# Patient Record
Sex: Female | Born: 2000 | Race: White | Hispanic: Yes | Marital: Single | Smoking: Current some day smoker
Health system: Southern US, Community
[De-identification: ages and names within clinical notes are randomized; demographics above are authoritative.]

---
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
## 2018-04-08 ENCOUNTER — Ambulatory Visit: Payer: Medicaid Other | Attending: Family Medicine | Admitting: Physical Therapy

## 2018-04-08 ENCOUNTER — Encounter: Payer: Self-pay | Admitting: Physical Therapy

## 2018-04-08 ENCOUNTER — Other Ambulatory Visit: Payer: Self-pay

## 2018-04-08 DIAGNOSIS — G44209 Tension-type headache, unspecified, not intractable: Secondary | ICD-10-CM

## 2018-04-08 DIAGNOSIS — M542 Cervicalgia: Secondary | ICD-10-CM

## 2018-04-08 NOTE — Patient Instructions (Signed)
Access Code: [email or web address]  URL: [email or web address]  Date: 04/08/2018  Prepared by: Zettie PhoMargaret Trotter   Exercises  Seated Thoracic Flexion - 5 reps - 1 sets - 30 hold - 2x daily - 7x weekly  Seated Cervical Retraction - 10 reps - 2 sets - 2x daily - 7x weekly
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
## 2018-04-16 ENCOUNTER — Ambulatory Visit: Payer: Medicaid Other | Admitting: Physical Therapy

## 2018-04-16 ENCOUNTER — Ambulatory Visit: Payer: Medicaid Other

## 2018-04-16 ENCOUNTER — Encounter: Payer: Self-pay | Admitting: Physical Therapy

## 2018-04-16 DIAGNOSIS — M542 Cervicalgia: Secondary | ICD-10-CM

## 2018-04-16 DIAGNOSIS — G44209 Tension-type headache, unspecified, not intractable: Secondary | ICD-10-CM
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
## 2018-04-18 ENCOUNTER — Ambulatory Visit: Payer: Medicaid Other | Admitting: Physical Therapy

## 2018-04-24 ENCOUNTER — Ambulatory Visit: Payer: Medicaid Other | Attending: Family Medicine | Admitting: Physical Therapy

## 2018-04-24 ENCOUNTER — Encounter: Payer: Self-pay | Admitting: Physical Therapy

## 2018-04-24 DIAGNOSIS — G44209 Tension-type headache, unspecified, not intractable: Secondary | ICD-10-CM | POA: Insufficient documentation

## 2018-04-24 DIAGNOSIS — M542 Cervicalgia: Secondary | ICD-10-CM | POA: Diagnosis present
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
## 2018-04-29 ENCOUNTER — Encounter: Payer: Medicaid Other | Admitting: Physical Therapy

## 2018-04-30 ENCOUNTER — Ambulatory Visit: Payer: Medicaid Other | Admitting: Physical Therapy

## 2018-05-02 ENCOUNTER — Encounter: Payer: Self-pay | Admitting: Physical Therapy

## 2018-05-02 ENCOUNTER — Ambulatory Visit: Payer: Medicaid Other | Admitting: Physical Therapy

## 2018-05-02 DIAGNOSIS — M542 Cervicalgia: Secondary | ICD-10-CM | POA: Diagnosis not present

## 2018-05-02 DIAGNOSIS — G44209 Tension-type headache, unspecified, not intractable: Secondary | ICD-10-CM

## 2018-05-02 NOTE — Patient Instructions (Signed)
  Copyright  VHI. All rights reserved.  Shoulder Retraction   Tie band around door knob (sitting or standing, holding band in both hands) Facing chest height anchor, grasp ends of band and pull hands to chest, squeezing shoulder blades together. Hold _3-5seconds. Repeat _10 times. Do _2_ sessions per day. Safety Note: Be sure anchor is secure.  Copyright  VHI. All rights reserved.  Strengthening: Resisted Extension   Hold band in both hands, Pull arm back, elbow straight, squeezing shoulder blades, Repeat _10___ times per set. Do _2___ sets per session. Do __1__ sessions per day.  http://orth.exer.us/833   Copyright  VHI. All rights reserved.   Copyright  VHI. All rights reserved.  Reverse Fly / Shoulder Retraction  Holding band in both hands, Extend both arms in front of body at shoulder height, palms down, holding band. Move arms out to sides, squeeze shoulder blades together. Repeat _10__ times. Do _2__ sessions per day.   Copyright  VHI. All rights reserved.   Roll   Inhale and bring shoulders up, back, then exhale and relax shoulders down. Repeat _10__ times. Do _2-3__ times per day.  Copyright  VHI. All rights reserved.     
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
## 2018-05-07 ENCOUNTER — Encounter: Payer: Self-pay | Admitting: Physical Therapy

## 2018-05-07 ENCOUNTER — Ambulatory Visit: Payer: Medicaid Other | Admitting: Physical Therapy

## 2018-05-07 DIAGNOSIS — G44209 Tension-type headache, unspecified, not intractable: Secondary | ICD-10-CM

## 2018-05-07 DIAGNOSIS — M542 Cervicalgia: Secondary | ICD-10-CM | POA: Diagnosis not present
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
## 2018-05-09 ENCOUNTER — Encounter: Payer: Medicaid Other | Admitting: Physical Therapy

## 2019-03-30 ENCOUNTER — Ambulatory Visit
Admission: RE | Admit: 2019-03-30 | Discharge: 2019-03-30 | Disposition: A | Discharge status: Home / Self Care | Payer: Medicaid Other | Attending: Family Medicine | Admitting: Family Medicine

## 2019-03-30 ENCOUNTER — Other Ambulatory Visit: Payer: Self-pay | Admitting: Family Medicine

## 2019-03-30 ENCOUNTER — Ambulatory Visit
Admission: RE | Admit: 2019-03-30 | Discharge: 2019-03-30 | Disposition: A | Discharge status: Home / Self Care | Payer: Medicaid Other | Source: Ambulatory Visit | Attending: Family Medicine | Admitting: Family Medicine

## 2019-03-30 DIAGNOSIS — T148XXA Other injury of unspecified body region, initial encounter: Secondary | ICD-10-CM | POA: Insufficient documentation

## 2019-05-23 ENCOUNTER — Other Ambulatory Visit: Payer: Self-pay

## 2019-05-23 DIAGNOSIS — Z20822 Contact with and (suspected) exposure to covid-19: Secondary | ICD-10-CM

## 2019-05-24 LAB — NOVEL CORONAVIRUS, NAA: SARS-CoV-2, NAA: NOT DETECTED

## 2019-05-30 ENCOUNTER — Other Ambulatory Visit: Payer: Self-pay | Admitting: *Deleted

## 2019-05-30 DIAGNOSIS — Z20822 Contact with and (suspected) exposure to covid-19: Secondary | ICD-10-CM

## 2019-05-31 LAB — NOVEL CORONAVIRUS, NAA: SARS-CoV-2, NAA: NOT DETECTED

## 2019-10-03 ENCOUNTER — Ambulatory Visit: Payer: Medicaid Other | Attending: Internal Medicine

## 2019-10-03 DIAGNOSIS — Z23 Encounter for immunization: Secondary | ICD-10-CM

## 2019-10-03 NOTE — Progress Notes (Signed)
   Covid-19 Vaccination Clinic  Name:  Gloria Crawford    MRN: [id number] DOB: 01/27/2001  10/03/2019  [name] was observed post Covid-19 immunization for 15 minutes without incident. She was provided with Vaccine Information Sheet and instruction to access the V-Safe system.   [name] was instructed to call 911 with any severe reactions post vaccine: Marland Kitchen Difficulty breathing  . Swelling of face and throat  . A fast heartbeat  . A bad rash all over body  . Dizziness and weakness   Immunizations Administered    Name Date Dose VIS Date Route   Pfizer COVID-19 Vaccine 10/03/2019 11:30 AM 0.3 mL 07/04/2019 Intramuscular   Manufacturer: [employer], [employer]   Lot: [id number]   NDC: [address]-0

## 2019-10-29 ENCOUNTER — Ambulatory Visit: Payer: Medicaid Other | Attending: Internal Medicine

## 2019-10-29 DIAGNOSIS — Z23 Encounter for immunization: Secondary | ICD-10-CM

## 2019-10-29 NOTE — Progress Notes (Signed)
   Covid-19 Vaccination Clinic  Name:  Delainie Chavana    MRN: [id number] DOB: 2000-08-10  10/29/2019  Ms. Sukup was observed post Covid-19 immunization for 15 minutes without incident. She was provided with Vaccine Information Sheet and instruction to access the V-Safe system.   Ms. Hebard was instructed to call 911 with any severe reactions post vaccine: Marland Kitchen Difficulty breathing  . Swelling of face and throat  . A fast heartbeat  . A bad rash all over body  . Dizziness and weakness   Immunizations Administered    Name Date Dose VIS Date Route   Pfizer COVID-19 Vaccine 10/29/2019 12:15 PM 0.3 mL 07/04/2019 Intramuscular   Manufacturer: [employer], [employer]   Lot: [id number]   NDC: [address]-9

## 2019-12-25 ENCOUNTER — Other Ambulatory Visit: Payer: Self-pay

## 2019-12-25 ENCOUNTER — Encounter: Payer: Self-pay | Admitting: Physician Assistant

## 2019-12-25 ENCOUNTER — Ambulatory Visit: Payer: Self-pay | Admitting: Physician Assistant

## 2019-12-25 DIAGNOSIS — F909 Attention-deficit hyperactivity disorder, unspecified type: Secondary | ICD-10-CM

## 2019-12-25 DIAGNOSIS — Z113 Encounter for screening for infections with a predominantly sexual mode of transmission: Secondary | ICD-10-CM

## 2019-12-25 LAB — WET PREP FOR TRICH, YEAST, CLUE
Trichomonas Exam: NEGATIVE
Yeast Exam: NEGATIVE

## 2019-12-25 NOTE — Progress Notes (Signed)
Wet mount reviewed with provider and no treatment needed for wet mount per Sadie Haber, PA verbal order. Provider orders completed.

## 2019-12-25 NOTE — Progress Notes (Signed)
[facility] Department STI clinic/screening visit  Subjective:  Gloria Crawford is a 19 y.o. female being seen today for an STI screening visit. The patient reports they do have symptoms.  Patient reports that they do not desire a pregnancy in the next year.   They reported they are not interested in discussing contraception today.  No LMP recorded (exact date).   Patient has the following medical conditions:  There are no problems to display for this patient.   Chief Complaint  Patient presents with  . SEXUALLY TRANSMITTED DISEASE    screening    HPI  Patient reports that she thinks she may have a "little UTI" since she has had a little burning off and on for 2 weeks.  Denies other symptoms.  States that she has a [place] IUD and has irregular periods with this method, has not had a pap yet since she is not 19 yo and has not had a HIV test before.    See flowsheet for further details and programmatic requirements.    The following portions of the patient's history were reviewed and updated as appropriate: allergies, current medications, past medical history, past social history, past surgical history and problem list.  Objective:  There were no vitals filed for this visit.  Physical Exam Constitutional:      General: She is not in acute distress.    Appearance: Normal appearance.  HENT:     Head: Normocephalic and atraumatic.     Comments: No nits, lice, or hair loss. No cervical, supraclavicular or axillary adenopathy.    Mouth/Throat:     Mouth: Mucous membranes are moist.     Pharynx: Oropharynx is clear. No oropharyngeal exudate or posterior oropharyngeal erythema.  Eyes:     Conjunctiva/sclera: Conjunctivae normal.  Pulmonary:     Effort: Pulmonary effort is normal.  Abdominal:     Palpations: Abdomen is soft. There is no mass.     Tenderness: There is no abdominal tenderness. There is no guarding or rebound.  Genitourinary:    General: Normal  vulva.     Rectum: Normal.     Comments: External genitalia/pubic area without nits, lice, edema, erythema, lesions and inguinal adenopathy. Vagina with normal mucosa and discharge. Cervix without visible lesions and IUD strings visualized from os. Uterus firm, mobile, nt, no masses, no CMT, no adnexal tenderness or fullness. Musculoskeletal:     Cervical back: Neck supple. No tenderness.  Skin:    General: Skin is warm and dry.     Findings: No bruising, erythema, lesion or rash.  Neurological:     Mental Status: She is alert and oriented to person, place, and time.  Psychiatric:        Mood and Affect: Mood normal.        Behavior: Behavior normal.        Thought Content: Thought content normal.        Judgment: Judgment normal.      Assessment and Plan:  Jasimine Simms is a 19 y.o. female presenting to the [facility] Department for STI screening  1. Screening for STD (sexually transmitted disease) Patient into clinic with symptoms. Rec condoms with all sex. Await test results.  Counseled that RN will call if needs to RTC for treatment once results are back. Rec that patient follow up with PCP for evaluation if symptoms persist, worsen or starts to have nausea, vomiting, fever, chills. Enc to continue with adequate water intake. - WET PREP FOR  [place], YEAST, [place] [facility] - Syphilis Serology, [place] [facility]     Return if symptoms worsen or fail to improve.  No future appointments.  Jerene Dilling, PA

## 2019-12-30 LAB — GONOCOCCUS CULTURE

## 2020-02-06 ENCOUNTER — Ambulatory Visit: Payer: Medicaid Other

## 2020-02-06 ENCOUNTER — Ambulatory Visit: Payer: Medicaid Other | Admitting: Family Medicine

## 2020-02-06 ENCOUNTER — Other Ambulatory Visit: Payer: Self-pay

## 2020-02-06 ENCOUNTER — Encounter: Payer: Self-pay | Admitting: Family Medicine

## 2020-02-06 DIAGNOSIS — N907 Vulvar cyst: Secondary | ICD-10-CM

## 2020-02-06 DIAGNOSIS — Z113 Encounter for screening for infections with a predominantly sexual mode of transmission: Secondary | ICD-10-CM

## 2020-02-06 LAB — WET PREP FOR TRICH, YEAST, CLUE: Trichomonas Exam: NEGATIVE

## 2020-02-06 NOTE — Progress Notes (Signed)
Here with vaginal symptoms, requesting STD screen. Sharlyne Pacas, RN

## 2020-02-06 NOTE — Progress Notes (Signed)
[facility] Department STI clinic/screening visit  Subjective:  Gloria Crawford is a 19 y.o. female being seen today for  Chief Complaint  Patient presents with  . Exposure to STD     The patient reports they do have symptoms. Patient reports that they do not desire a pregnancy in the next year. They reported they are not interested in discussing contraception today.   Patient has the following medical conditions:   Patient Active Problem List   Diagnosis Date Noted  . ADHD 12/25/2019  . Chronic daily headache 05/20/2015  . Migraine 08/23/2014    HPI  Pt reports she has an itchy bump and brown spots on her labia, has been present x1 wk, getting smaller. Denies pain or tenderness.  See flowsheet for further details and programmatic requirements.    Patient's last menstrual period was 01/19/2020 (approximate). Last sex: 6/30 BCM: kyleena IUD Desires EC? n/a  Last pap per pt/review of record: n/a dt age Last HIV test per pt/review of record: 12/25/19, declines today  Last tetanus vaccine: 03/2010   No components found for: HCV  The following portions of the patient's history were reviewed and updated as appropriate: allergies, current medications, past medical history, past social history, past surgical history and problem list.  Objective:  There were no vitals filed for this visit.  Physical Exam Vitals and nursing note reviewed.  Constitutional:      Appearance: Normal appearance.  HENT:     Head: Normocephalic and atraumatic.     Mouth/Throat:     Mouth: Mucous membranes are moist.     Pharynx: Oropharynx is clear. No oropharyngeal exudate or posterior oropharyngeal erythema.  Pulmonary:     Effort: Pulmonary effort is normal.  Abdominal:     General: Abdomen is flat.     Palpations: There is no mass.     Tenderness: There is no abdominal tenderness. There is no rebound.  Genitourinary:    General: Normal vulva.     Exam position: Lithotomy  position.     Pubic Area: No rash or pubic lice.      Labia:        Right: No rash or lesion.        Left: No rash or lesion.      Vagina: Normal. No vaginal discharge, erythema, bleeding or lesions.     Cervix: No cervical motion tenderness, discharge, friability, lesion or erythema.     Uterus: Normal.      Adnexa: Right adnexa normal and left adnexa normal.     Rectum: Normal.    Lymphadenopathy:     Head:     Right side of head: No preauricular or posterior auricular adenopathy.     Left side of head: No preauricular or posterior auricular adenopathy.     Cervical: No cervical adenopathy.     Upper Body:     Right upper body: No supraclavicular or axillary adenopathy.     Left upper body: No supraclavicular or axillary adenopathy.     Lower Body: No right inguinal adenopathy. No left inguinal adenopathy.  Skin:    General: Skin is warm and dry.     Findings: No rash.  Neurological:     Mental Status: She is alert and oriented to person, place, and time.      Assessment and Plan:  Gloria Crawford is a 19 y.o. female presenting to the [facility] Department for STI screening    1. Screening examination for venereal  disease -Pt with symptoms. Screenings today as below. Treat wet prep per standing order. -Patient does not meet criteria for HepB, HepC Screening. Declines these screenings. -Counseled on warning s/sx and when to seek care. Recommended condom use with all sex and discussed importance of condom use for STI prevention. - WET PREP FOR TRICH, YEAST, CLUE - Chlamydia/Gonorrhea [place] [facility]  2. Labial cyst -Small likely epidermoid cyst on superior labia minora. Advised warm compresses to area and to RTC if symptoms worsen, fail to resolve or blister appears.    Return if symptoms worsen or fail to improve.  No future appointments.  Ann Held, PA-C

## 2020-02-06 NOTE — Progress Notes (Signed)
[employer] results reviewed by provider Maximiano Coss, PA-C. Per same no treatment indicated. Tawny Hopping, RN

## 2020-05-19 ENCOUNTER — Ambulatory Visit: Payer: Medicaid Other | Admitting: Family Medicine

## 2020-05-19 ENCOUNTER — Other Ambulatory Visit: Payer: Self-pay

## 2020-05-19 ENCOUNTER — Encounter: Payer: Self-pay | Admitting: Family Medicine

## 2020-05-19 DIAGNOSIS — Z113 Encounter for screening for infections with a predominantly sexual mode of transmission: Secondary | ICD-10-CM

## 2020-05-19 DIAGNOSIS — N760 Acute vaginitis: Secondary | ICD-10-CM | POA: Diagnosis not present

## 2020-05-19 DIAGNOSIS — B9689 Other specified bacterial agents as the cause of diseases classified elsewhere: Secondary | ICD-10-CM

## 2020-05-19 LAB — WET PREP FOR TRICH, YEAST, CLUE
Trichomonas Exam: NEGATIVE
Yeast Exam: NEGATIVE

## 2020-05-19 MED ORDER — METRONIDAZOLE 500 MG PO TABS: 500.0000 mg | ORAL_TABLET | Freq: Two times a day (BID) | ORAL | 0 refills | Status: AC

## 2020-05-19 NOTE — Progress Notes (Signed)
[facility] Department STI clinic/screening visit  Subjective:  Gloria Crawford is a 19 y.o. female being seen today for an STI screening visit. The patient reports they do have symptoms.  Patient reports that they do not desire a pregnancy in the next year.   They reported they are not interested in discussing contraception today.  Patient's last menstrual period was 04/25/2020 (exact date).   Patient has the following medical conditions:   Patient Active Problem List   Diagnosis Date Noted  . ADHD 12/25/2019  . Chronic daily headache 05/20/2015  . Migraine 08/23/2014    Chief Complaint  Patient presents with  . SEXUALLY TRANSMITTED DISEASE    screening    HPI  Patient reports she has had a small amount of white discharge with a strong fishy odor x 1 week  Last HIV test per patient/review of record was 12/2019 Patient reports last pap was 9/21.   See flowsheet for further details and programmatic requirements.    The following portions of the patient's history were reviewed and updated as appropriate: allergies, current medications, past medical history, past social history, past surgical history and problem list.  Objective:  There were no vitals filed for this visit.  Physical Exam Vitals and nursing note reviewed.  Constitutional:      Appearance: Normal appearance.  HENT:     Head: Normocephalic and atraumatic.     Mouth/Throat:     Mouth: Mucous membranes are moist.     Pharynx: Oropharynx is clear. No oropharyngeal exudate or posterior oropharyngeal erythema.  Pulmonary:     Effort: Pulmonary effort is normal.  Abdominal:     General: Abdomen is flat.     Palpations: There is no mass.     Tenderness: There is no abdominal tenderness. There is no rebound.  Genitourinary:    General: Normal vulva.     Exam position: Lithotomy position.     Pubic Area: No rash or pubic lice.      Labia:        Right: No rash or lesion.        Left: No rash or  lesion.      Vagina: Vaginal discharge present. No erythema, bleeding or lesions.     Cervix: No discharge, friability, lesion or erythema.     Comments: Thick white discharge, pH> 4.5, + odor noted on exam Lymphadenopathy:     Head:     Right side of head: No preauricular or posterior auricular adenopathy.     Left side of head: No preauricular or posterior auricular adenopathy.     Cervical: No cervical adenopathy.     Upper Body:     Right upper body: No supraclavicular or axillary adenopathy.     Left upper body: No supraclavicular or axillary adenopathy.     Lower Body: No right inguinal adenopathy. No left inguinal adenopathy.  Skin:    General: Skin is warm and dry.     Findings: No rash.  Neurological:     Mental Status: She is alert and oriented to person, place, and time.    Assessment and Plan:  Gloria Crawford is a 19 y.o. female presenting to the [facility] Department for STI screening  1. Screening examination for venereal disease  - WET PREP FOR TRICH, YEAST, CLUE - Gonococcus culture - Chlamydia/Gonorrhea [place] [facility] - HIV [place] [facility] - Syphilis Serology, [place] [facility]   2. BV (bacterial vaginosis)  - metroNIDAZOLE (FLAGYL) 500  MG tablet; Take 1 tablet (500 mg total) by mouth 2 (two) times daily for 7 days.  Dispense: 14 tablet; Refill: 0 Co to abstain from sex x 1 week and to use condoms always for STD prevention.   No follow-ups on file.  No future appointments.  Larene Pickett, FNP

## 2020-05-19 NOTE — Progress Notes (Signed)
Wet mount reviewed and pt treated for BV per Larene Pickett, FNP verbal order, and per standing order. Provider orders completed.

## 2020-05-19 NOTE — Progress Notes (Signed)
Pt is here for STD screening. 

## 2020-05-24 LAB — GONOCOCCUS CULTURE

## 2020-11-05 IMAGING — CR DG FINGER MIDDLE 2+V*L*
1 series · 3 of 3 positions shown · non-contrast
Comparison: None.

CLINICAL DATA: Crush injury to the middle finger.

EXAM:
LEFT MIDDLE FINGER 2+V

[Series 1: dg finger middle left · 0.14mm/px · 3 of 3 slices shown]
[im 1/3]
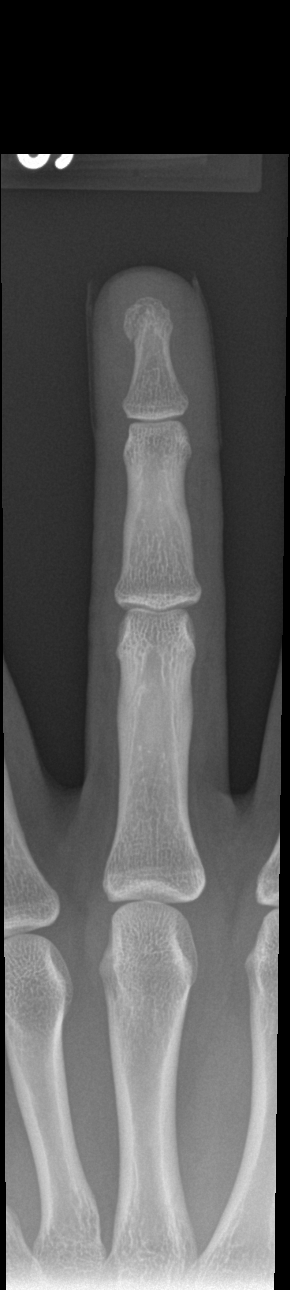
[im 2/3]
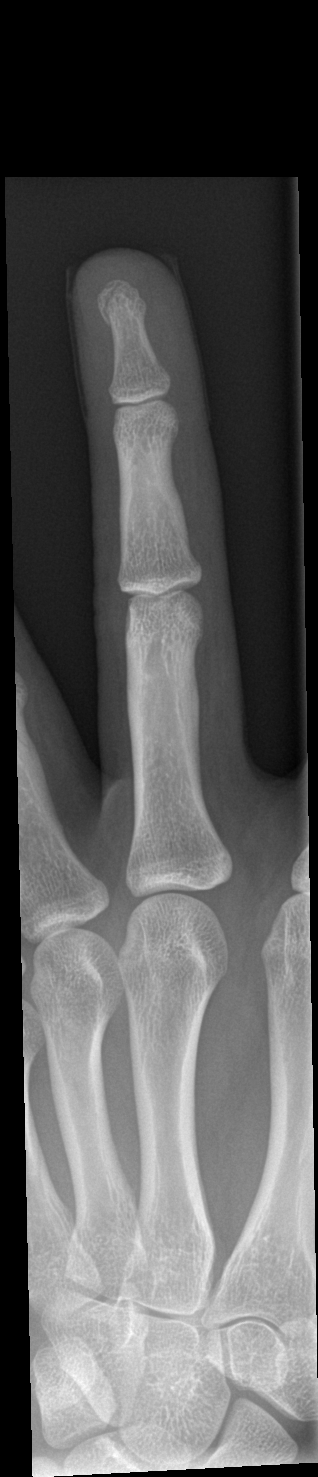
[im 3/3]
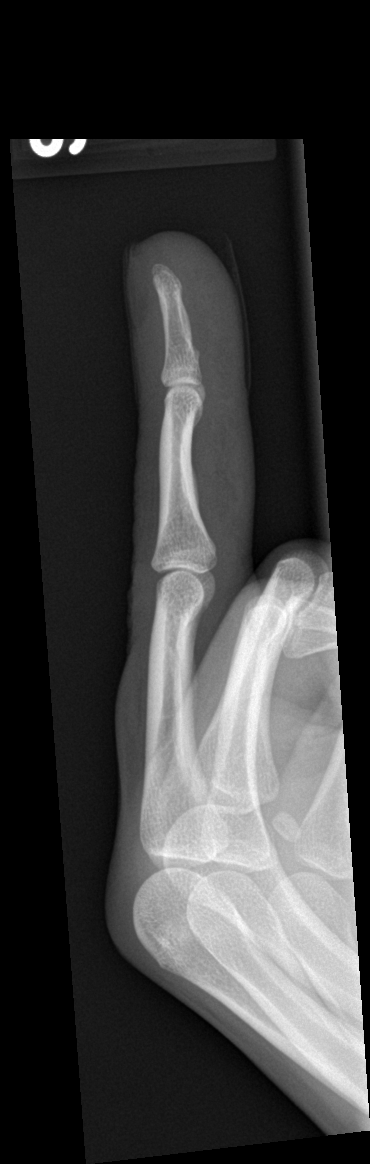

[3 of 3 positions shown; findings below may reference images not displayed]

FINDINGS: A nondisplaced fracture is present in the tuft of the distal
phalanx. The joints are intact. No additional fractures are present.
There is associated soft tissue swelling. No radiopaque foreign body
is present.
IMPRESSION: Nondisplaced fracture in the tuft of the distal phalanx.
